# Patient Record
Sex: Male | Born: 1935 | Hispanic: Refuse to answer | Marital: Married | Smoking: Former smoker
Health system: Southern US, Community
[De-identification: ages and names within clinical notes are randomized; demographics above are authoritative.]

## PROBLEM LIST (undated history)

## (undated) DIAGNOSIS — A6001 Herpesviral infection of penis: Secondary | ICD-10-CM

## (undated) DIAGNOSIS — N393 Stress incontinence (female) (male): Secondary | ICD-10-CM

## (undated) DIAGNOSIS — I10 Essential (primary) hypertension: Secondary | ICD-10-CM

## (undated) DIAGNOSIS — J40 Bronchitis, not specified as acute or chronic: Secondary | ICD-10-CM

## (undated) DIAGNOSIS — I2510 Atherosclerotic heart disease of native coronary artery without angina pectoris: Secondary | ICD-10-CM

## (undated) DIAGNOSIS — J84112 Idiopathic pulmonary fibrosis: Secondary | ICD-10-CM

## (undated) DIAGNOSIS — J159 Unspecified bacterial pneumonia: Secondary | ICD-10-CM

## (undated) HISTORY — DX: Bronchitis, not specified as acute or chronic: J40

## (undated) HISTORY — DX: Essential (primary) hypertension: I10

## (undated) HISTORY — DX: Idiopathic pulmonary fibrosis: J84.112

## (undated) HISTORY — PX: WRIST SURGERY: SHX841

## (undated) HISTORY — DX: Atherosclerotic heart disease of native coronary artery without angina pectoris: I25.10

## (undated) HISTORY — DX: Herpesviral infection of penis: A60.01

## (undated) HISTORY — DX: Stress incontinence (female) (male): N39.3

## (undated) HISTORY — PX: CATARACT EXTRACTION, BILATERAL: SHX1313

## (undated) HISTORY — DX: Unspecified bacterial pneumonia: J15.9

---

## 2002-02-07 DIAGNOSIS — A6001 Herpesviral infection of penis: Secondary | ICD-10-CM

## 2002-02-07 HISTORY — DX: Herpesviral infection of penis: A60.01

## 2007-06-14 DIAGNOSIS — J159 Unspecified bacterial pneumonia: Secondary | ICD-10-CM

## 2007-06-14 HISTORY — DX: Unspecified bacterial pneumonia: J15.9

## 2010-11-04 ENCOUNTER — Encounter: Payer: Self-pay | Admitting: Emergency Medicine

## 2010-11-04 DIAGNOSIS — I10 Essential (primary) hypertension: Secondary | ICD-10-CM | POA: Insufficient documentation

## 2010-11-04 DIAGNOSIS — M109 Gout, unspecified: Secondary | ICD-10-CM | POA: Insufficient documentation

## 2010-11-04 DIAGNOSIS — J309 Allergic rhinitis, unspecified: Secondary | ICD-10-CM | POA: Insufficient documentation

## 2010-11-04 DIAGNOSIS — R0600 Dyspnea, unspecified: Secondary | ICD-10-CM

## 2010-11-04 DIAGNOSIS — I2510 Atherosclerotic heart disease of native coronary artery without angina pectoris: Secondary | ICD-10-CM | POA: Insufficient documentation

## 2010-11-05 ENCOUNTER — Ambulatory Visit (INDEPENDENT_AMBULATORY_CARE_PROVIDER_SITE_OTHER): Payer: Medicare Other | Admitting: Emergency Medicine

## 2010-11-05 ENCOUNTER — Encounter: Payer: Self-pay | Admitting: Emergency Medicine

## 2010-11-05 ENCOUNTER — Other Ambulatory Visit (HOSPITAL_COMMUNITY): Payer: Self-pay | Admitting: Emergency Medicine

## 2010-11-05 VITALS — BP 108/70 | HR 59 | Temp 97.5°F | Ht 69.0 in | Wt 173.6 lb

## 2010-11-05 DIAGNOSIS — J849 Interstitial pulmonary disease, unspecified: Secondary | ICD-10-CM | POA: Insufficient documentation

## 2010-11-05 DIAGNOSIS — R05 Cough: Secondary | ICD-10-CM

## 2010-11-05 DIAGNOSIS — J84115 Respiratory bronchiolitis interstitial lung disease: Secondary | ICD-10-CM

## 2010-11-05 DIAGNOSIS — R1310 Dysphagia, unspecified: Secondary | ICD-10-CM

## 2010-11-05 NOTE — Patient Instructions (Signed)
We will set up breathing tests to be done at your next visit We will arrange for a swallowing test  Stop your [facility] for now Your walking oximetry shows that you will benefit from oxygen to wear when you are exerting yourself Follow up with Dr Delton Coombes in 1 month

## 2010-11-05 NOTE — Assessment & Plan Note (Addendum)
Stable CT scan from 2009 to 05/2010. I suspect that he is chronically aspirating and that he had a RUL PNA at some point with focal scar. Not clear whether he has clinically significant COPD although his CT scan does show some areas that look emphysematous. His smoking hx is minor.  - walking oximetry today shows desaturations - will arrange for O2 - swallow eval speech rx eval - full PFT - stop Dulera for now given his cataracts and his UA irritation and hoarse voice.  - rov after testing to review

## 2010-11-05 NOTE — Progress Notes (Deleted)
  Subjective:    Patient ID: Danny Alvarado, male    DOB: 28-Jun-1927, 77 y.o.   MRN: [id number]  HPI    Review of Systems  Constitutional: Negative.   HENT: Positive for sore throat.   Eyes: Negative.   Respiratory: Positive for cough and shortness of breath.   Cardiovascular: Positive for chest pain.  Gastrointestinal: Negative.   Genitourinary: Negative.   Musculoskeletal: Negative.   Skin: Negative.   Neurological: Negative.   Hematological: Negative.   Psychiatric/Behavioral: The patient is nervous/anxious.        Objective:   Physical Exam        Assessment & Plan:  SATURATION QUALIFICATIONS:  Patient Saturations on Room Air at Rest = 98%  Patient Saturations on Room Air while Ambulating = 86%  Patient Saturations on 2 Liters of oxygen while Ambulating = 91%

## 2010-11-05 NOTE — Progress Notes (Signed)
Subjective:    Patient ID: Danny Alvarado, male    DOB: 14-Apr-1927, 77 y.o.   MRN: [id number]  HPI 77 yo man, former smoker, hx of HTN, anxiety, cataracts. He has hx chronic dyspnea, tells me that he felt well until acute episode in March when he woke up with SOB such that he couldn't move about, couldn't eat or drink. Was taken to [facility] and treated for an apparent panic attack - breathing returned to normal. He had CP, but no fever, no cough or sputum. Was treated with abx.  He feels like he is back to baseline but continues to have exertional SOB, nasal congestion, hoarse voice. He has been treated with Spiriva without effect, recently changed to [facility] - may help some, but minimally. A CT scan in March showed RUL GGI/scar and some mild peripheral ILD. Repeat scan in April without any change. In retrospect he has a CT scan from 2009 that shows the RUL and peripheral ILD are unchanged over that time period.   SATURATION QUALIFICATIONS:  Patient Saturations on Room Air at Rest = 98%  Patient Saturations on Room Air while Ambulating = 86%  Patient Saturations on 2 Liters of oxygen while Ambulating = 91%   Review of Systems  Constitutional: Positive for activity change. Negative for fever, chills, diaphoresis, fatigue and unexpected weight change.  HENT: Positive for rhinorrhea and postnasal drip.   Respiratory: Positive for choking (gets choked on food, drink, saliva - happens every few weeks), chest tightness, shortness of breath and stridor. Negative for apnea, cough and wheezing.   Cardiovascular: Positive for chest pain.    Past Medical History  Diagnosis Date  . Gout 10/20/2005  . Bacterial pneumonia 06/14/2007  . Allergic rhinitis 03/31/2005  . CAD (coronary artery disease)   . Hypertension   . Herpetic infection of penis 02/07/2002  . Bronchitis   . Male stress incontinence   . IPF (idiopathic pulmonary fibrosis)      Family History  Problem Relation Age of Onset  .  Diabetes Mother      History   Social History  . Marital Status: Married    Spouse Name: N/A    Number of Children: N/A  . Years of Education: N/A   Occupational History  . Not on file.   Social History Main Topics  . Smoking status: Former Smoker -- 0.3 packs/day for 40 years    Types: Cigarettes    Quit date: 02/25/1988  . Smokeless tobacco: Never Used  . Alcohol Use: No  . Drug Use: No  . Sexually Active: Not on file   Other Topics Concern  . Not on file   Social History Narrative  . No narrative on file     Allergies  Allergen Reactions  . Tessalon Perles Hives     Outpatient Prescriptions Prior to Visit  Medication Sig Dispense Refill  . atenolol (TENORMIN) 25 MG tablet Take 25 mg by mouth daily.        . Mometasone Furo-Formoterol Fum (DULERA) 200-5 MCG/ACT AERO Inhale 2 puffs into the lungs 2 (two) times daily.        Marland Kitchen NIFEdipine (PROCARDIA-XL/ADALAT CC) 60 MG 24 hr tablet Take 60 mg by mouth daily.        . solifenacin (VESICARE) 10 MG tablet Take 10 mg by mouth daily.             Objective:   Physical Exam  Gen: Pleasant, elderly man, in no distress,  normal affect  ENT: No lesions,  mouth clear,  oropharynx clear, no postnasal drip, hoarse voice  Neck: No JVD, no TMG, no carotid bruits  Lungs: No use of accessory muscles, some soft insp crackles RUL  Cardiovascular: RRR, heart sounds normal, no murmur or gallops, no peripheral edema  Musculoskeletal: No deformities, no cyanosis or clubbing  Neuro: alert, non focal  Skin: Warm, no lesions or rashes      Assessment & Plan:  ILD (interstitial lung disease) Stable CT scan from 2009 to 05/2010. I suspect that he is chronically aspirating and that he had a RUL PNA at some point with focal scar. Not clear whether he has clinically significant COPD although his CT scan does show some areas that look emphysematous. His smoking hx is minor.  - walking oximetry today shows desaturations - will  arrange for O2 - swallow eval speech rx eval - full PFT - stop Dulera for now given his cataracts and his UA irritation and hoarse voice.  - rov after testing to review

## 2010-11-18 ENCOUNTER — Ambulatory Visit (HOSPITAL_COMMUNITY)
Admission: RE | Admit: 2010-11-18 | Discharge: 2010-11-18 | Disposition: A | Discharge status: Home / Self Care | Payer: Medicare Other | Source: Ambulatory Visit | Attending: Emergency Medicine | Admitting: Emergency Medicine

## 2010-11-18 DIAGNOSIS — R059 Cough, unspecified: Secondary | ICD-10-CM | POA: Insufficient documentation

## 2010-11-18 DIAGNOSIS — R1310 Dysphagia, unspecified: Secondary | ICD-10-CM

## 2010-11-18 DIAGNOSIS — R05 Cough: Secondary | ICD-10-CM

## 2010-11-18 DIAGNOSIS — J849 Interstitial pulmonary disease, unspecified: Secondary | ICD-10-CM

## 2010-11-27 ENCOUNTER — Ambulatory Visit (INDEPENDENT_AMBULATORY_CARE_PROVIDER_SITE_OTHER): Payer: Medicare Other | Admitting: Emergency Medicine

## 2010-11-27 ENCOUNTER — Encounter: Payer: Self-pay | Admitting: Emergency Medicine

## 2010-11-27 VITALS — BP 118/70 | HR 63 | Temp 97.8°F | Ht 69.0 in | Wt 172.0 lb

## 2010-11-27 DIAGNOSIS — J84115 Respiratory bronchiolitis interstitial lung disease: Secondary | ICD-10-CM

## 2010-11-27 DIAGNOSIS — J849 Interstitial pulmonary disease, unspecified: Secondary | ICD-10-CM

## 2010-11-27 LAB — PULMONARY FUNCTION TEST

## 2010-11-27 NOTE — Progress Notes (Signed)
  Subjective:    Patient ID: Danny Alvarado, male    DOB: 09-18-1927, 77 y.o.   MRN: [id number]  HPI 77 yo man, former smoker, hx of HTN, anxiety, cataracts. He has hx chronic dyspnea, tells me that he felt well until acute episode in March when he woke up with SOB such that he couldn't move about, couldn't eat or drink. Was taken to [facility] and treated for an apparent panic attack - breathing returned to normal. He had CP, but no fever, no cough or sputum. Was treated with abx.  He feels like he is back to baseline but continues to have exertional SOB, nasal congestion, hoarse voice. He has been treated with Spiriva without effect, recently changed to [facility] - may help some, but minimally. A CT scan in March showed RUL GGI/scar and some mild peripheral ILD. Repeat scan in April without any change. In retrospect he has a CT scan from 2009 that shows the RUL and peripheral ILD are unchanged over that time period.   SATURATION QUALIFICATIONS:  Patient Saturations on Room Air at Rest = 98%  Patient Saturations on Room Air while Ambulating = 86%  Patient Saturations on 2 Liters of oxygen while Ambulating = 91%  ROV 11/27/10 -- returns for ILD stable on CT scan since 2009, hx tobacco. Last time we started O2 with exertion.  PFT done today showed normal airflows, decreased RV, decreased DLCO.  We stopped Dulera last time, he doesn't miss it.Voice is better.  He feels that the O2 is helping his exertional tolerance.     Review of Systems  Constitutional: Positive for activity change. Negative for fever, chills, diaphoresis, fatigue and unexpected weight change.  HENT: Positive for rhinorrhea and postnasal drip.   Respiratory: Positive for choking (gets choked on food, drink, saliva - happens every few weeks), chest tightness, shortness of breath and stridor. Negative for apnea, cough and wheezing.   Cardiovascular: Positive for chest pain.       Objective:   Physical Exam  Gen: Pleasant, elderly  man, in no distress,  normal affect  ENT: No lesions,  mouth clear,  oropharynx clear, no postnasal drip, hoarse voice  Neck: No JVD, no TMG, no carotid bruits  Lungs: No use of accessory muscles, some soft insp crackles RUL  Cardiovascular: RRR, heart sounds normal, no murmur or gallops, no peripheral edema  Musculoskeletal: No deformities, no cyanosis or clubbing  Neuro: alert, non focal  Skin: Warm, no lesions or rashes      Assessment & Plan:  ILD (interstitial lung disease) MBS showed no aspiration. CT has been stable since 2009. He has PFT that show decrease DLCO consistent w ILD, nothing to support COPD.  - do not believe he needs BD's - we have confirmed that he does need O2 with exertion, he has benefited - we discussed possibly checking auto-immune labs to eval the ILD further. Given his age and the stabilty on CT scan, I don't know that this would be high yield. Will defer for now.

## 2010-11-27 NOTE — Patient Instructions (Signed)
Wear your oxygen with exertion We will not start any inhaled medications at this time.  We will repeat your CXR in a year, or sooner if your breathing changes in any way. Call our office if you have any problems.

## 2010-11-27 NOTE — Assessment & Plan Note (Addendum)
MBS showed no aspiration. CT has been stable since 2009. He has PFT that show decrease DLCO consistent w ILD, nothing to support COPD.  - do not believe he needs BD's - we have confirmed that he does need O2 with exertion, he has benefited - we discussed possibly checking auto-immune labs to eval the ILD further. Given his age and the stabilty on CT scan, I don't know that this would be high yield. Will defer for now.

## 2010-11-27 NOTE — Progress Notes (Signed)
PFT done today. 

## 2010-11-28 ENCOUNTER — Encounter: Payer: Self-pay | Admitting: Emergency Medicine

## 2011-07-30 ENCOUNTER — Telehealth: Payer: Self-pay | Admitting: Emergency Medicine

## 2011-07-30 NOTE — Telephone Encounter (Signed)
I spoke with the pt wife and she states the pt has been having increased SOB and chest congestion, and dry cough x 3 days. Pt feels a lot of phelgm in his chest but cannot cough it up. Pt last seen 11-2010 so appt offered for today but pt requested an appt tomorrow. Appt set fro [facility] tomorrow at 10:45. Carron Curie, CMA

## 2011-07-31 ENCOUNTER — Encounter: Payer: Self-pay | Admitting: Pulmonary Disease

## 2011-07-31 ENCOUNTER — Ambulatory Visit (INDEPENDENT_AMBULATORY_CARE_PROVIDER_SITE_OTHER): Payer: Medicare Other | Admitting: Pulmonary Disease

## 2011-07-31 ENCOUNTER — Ambulatory Visit (INDEPENDENT_AMBULATORY_CARE_PROVIDER_SITE_OTHER)
Admission: RE | Admit: 2011-07-31 | Discharge: 2011-07-31 | Disposition: A | Discharge status: Home / Self Care | Payer: Medicare Other | Source: Ambulatory Visit | Attending: Pulmonary Disease | Admitting: Pulmonary Disease

## 2011-07-31 VITALS — BP 108/78 | HR 67 | Temp 98.5°F | Ht 68.0 in | Wt 166.8 lb

## 2011-07-31 DIAGNOSIS — J849 Interstitial pulmonary disease, unspecified: Secondary | ICD-10-CM

## 2011-07-31 DIAGNOSIS — J8410 Pulmonary fibrosis, unspecified: Secondary | ICD-10-CM

## 2011-07-31 NOTE — Progress Notes (Signed)
Addended by: Nita Sells on: 07/31/2011 11:32 AM   Modules accepted: Orders

## 2011-07-31 NOTE — Patient Instructions (Signed)
Will check a cxr today for completeness, and call you with results. Will send a note to your primary physician about today's visit.  You need to call them for evaluation of your leg weakness and dizziness. Keep already scheduled followup with Dr. Delton Coombes.

## 2011-07-31 NOTE — Assessment & Plan Note (Signed)
The patient has multiple complaints today, most of which are most likely related to failure to thrive.  His main complaint is that of dizziness and significant leg weakness.  He also notes some increasing shortness of breath since the last visit here.  His lungs are actually fairly clear except for minimal basilar crackles, and his oxygen saturations are adequate.  He has no increased work of breathing.  I will check a chest x-ray today for completeness, but I have stressed to he and his wife they need to call his primary care physician for evaluation of his failure to thrive symptoms.

## 2011-07-31 NOTE — Progress Notes (Signed)
  Subjective:    Patient ID: Danny Alvarado, male    DOB: 1927-11-29, 78 y.o.   MRN: [id number]  HPI The patient comes in today for an acute sick visit.  He has known interstitial lung disease that is fairly mild, and is usually followed by Dr. Delton Coombes.  He notes increased dizziness and also has significant lower extremity weakness that is leading to decreased ambulation and activity.  He has some increase in shortness of breath from baseline, but it is not significant.  He has very little cough or congestion and no mucus production.  His blood pressure and oxygen saturations are adequate today.  He notes an occasional episode of chest discomfort with ambulation, but states that it is completely different than his anginal type pain in the past.   Review of Systems  Constitutional: Negative for fever and unexpected weight change.  HENT: Positive for rhinorrhea. Negative for ear pain, nosebleeds, congestion, sore throat, sneezing, trouble swallowing, dental problem, postnasal drip and sinus pressure.   Eyes: Negative.  Negative for redness and itching.  Respiratory: Positive for cough and shortness of breath. Negative for chest tightness and wheezing.   Cardiovascular: Positive for chest pain. Negative for palpitations and leg swelling.  Gastrointestinal: Negative.  Negative for nausea and vomiting.  Genitourinary: Negative.  Negative for dysuria.  Musculoskeletal: Negative.  Negative for joint swelling.  Skin: Negative.  Negative for rash.  Neurological: Positive for dizziness, tremors, weakness and numbness. Negative for headaches.  Hematological: Negative.  Does not bruise/bleed easily.  Psychiatric/Behavioral: Negative.  Negative for dysphoric mood. The patient is not nervous/anxious.        Objective:   Physical Exam Frail appearing male in no acute distress Nose without purulence or discharge noted Chest with faint basilar crackles, excellent air flow, no wheezes or rhonchi. Cardiac exam  with regular rate and rhythm Lower extremities with no significant edema, no cyanosis Alert and oriented, moves all 4 extremities, extremely weak in both legs with ambulation.       Assessment & Plan:

## 2011-09-18 ENCOUNTER — Ambulatory Visit: Payer: Medicare Other | Admitting: Emergency Medicine

## 2011-11-25 ENCOUNTER — Encounter: Payer: Self-pay | Admitting: Emergency Medicine

## 2011-11-25 ENCOUNTER — Ambulatory Visit (INDEPENDENT_AMBULATORY_CARE_PROVIDER_SITE_OTHER): Payer: Medicare Other | Admitting: Emergency Medicine

## 2011-11-25 VITALS — BP 90/58 | HR 63 | Temp 97.9°F | Ht 69.0 in | Wt 161.6 lb

## 2011-11-25 DIAGNOSIS — J8410 Pulmonary fibrosis, unspecified: Secondary | ICD-10-CM

## 2011-11-25 DIAGNOSIS — J849 Interstitial pulmonary disease, unspecified: Secondary | ICD-10-CM

## 2011-11-25 NOTE — Progress Notes (Signed)
  Subjective:    Patient ID: Danny Alvarado, male    DOB: 18-Mar-1927, 78 y.o.   MRN: [id number]  HPI 78 yo man, former smoker, hx of HTN, anxiety, cataracts. He has hx chronic dyspnea, tells me that he felt well until acute episode in March when he woke up with SOB such that he couldn't move about, couldn't eat or drink. Was taken to [facility] and treated for an apparent panic attack - breathing returned to normal. He had CP, but no fever, no cough or sputum. Was treated with abx.  He feels like he is back to baseline but continues to have exertional SOB, nasal congestion, hoarse voice. He has been treated with Spiriva without effect, recently changed to [facility] - may help some, but minimally. A CT scan in March showed RUL GGI/scar and some mild peripheral ILD. Repeat scan in April without any change. In retrospect he has a CT scan from 2009 that shows the RUL and peripheral ILD are unchanged over that time period.   SATURATION QUALIFICATIONS:  Patient Saturations on Room Air at Rest = 98%  Patient Saturations on Room Air while Ambulating = 86%  Patient Saturations on 2 Liters of oxygen while Ambulating = 91%  ROV 11/27/10 -- returns for ILD stable on CT scan since 2009, hx tobacco. Last time we started O2 with exertion.  PFT done today showed normal airflows, decreased RV, decreased DLCO.  We stopped Dulera last time, he doesn't miss it.Voice is better.  He feels that the O2 is helping his exertional tolerance.   ROV 11/25/11 -- Hx ILD, Hx tobacco use.  Here for regular f/u.  Now following with Dr Dulce Sellar in [place]. Off b-blocker, now on lisinopril/HCTZ. He is having problems with nasal gtt, taking loratadine prn.  His breathing has been slowly worse. He has not been wearing the o2 with exertion reliably.      Objective:   Physical Exam Filed Vitals:   11/25/11 1429  BP: 90/58  Pulse: 63  Temp: 97.9 F (36.6 C)    Gen: Pleasant, elderly man, in no distress,  normal affect  ENT: No lesions,   mouth clear,  oropharynx clear, no postnasal drip, hoarse voice  Neck: No JVD, no TMG, no carotid bruits  Lungs: No use of accessory muscles, some soft insp crackles RUL  Cardiovascular: RRR, heart sounds normal, no murmur or gallops, no peripheral edema  Musculoskeletal: No deformities, no cyanosis or clubbing  Neuro: alert, non focal  Skin: Warm, no lesions or rashes      Assessment & Plan:  ILD (interstitial lung disease) - repeat CT scan of the chest to look for interval change - encouraged him to wear his oxygen more reliably.  - follow in 1 month after the CT scan is done.

## 2011-11-25 NOTE — Assessment & Plan Note (Signed)
-   repeat CT scan of the chest to look for interval change - encouraged him to wear his oxygen more reliably.  - follow in 1 month after the CT scan is done.

## 2011-11-25 NOTE — Patient Instructions (Addendum)
We will perform CT scan of the chest to compare with prior.  Please start wearing your oxygen with all exertion including walking  Follow with Dr Delton Coombes in 1 month to review the CT scan.

## 2011-12-01 ENCOUNTER — Ambulatory Visit (INDEPENDENT_AMBULATORY_CARE_PROVIDER_SITE_OTHER)
Admission: RE | Admit: 2011-12-01 | Discharge: 2011-12-01 | Disposition: A | Discharge status: Home / Self Care | Payer: Medicare Other | Source: Ambulatory Visit | Attending: Emergency Medicine | Admitting: Emergency Medicine

## 2011-12-01 DIAGNOSIS — J8410 Pulmonary fibrosis, unspecified: Secondary | ICD-10-CM

## 2011-12-01 DIAGNOSIS — J849 Interstitial pulmonary disease, unspecified: Secondary | ICD-10-CM

## 2011-12-08 ENCOUNTER — Telehealth: Payer: Self-pay | Admitting: Emergency Medicine

## 2011-12-08 NOTE — Telephone Encounter (Signed)
Pt is requesting results of CT from 12-01-11. Please advise.Carron Curie, CMA

## 2011-12-09 NOTE — Telephone Encounter (Signed)
Please notify patient that his scarring is largely unchanged compared with his prior CT scan in 05/2010. There may be some minimal progression. Thanks

## 2011-12-09 NOTE — Telephone Encounter (Signed)
ATC x 2 and line was busy, WCB 

## 2011-12-09 NOTE — Telephone Encounter (Signed)
Spoke with the pt's spouse and notified of ct results per RB She verbalized understanding and states nothing further needed

## 2011-12-29 ENCOUNTER — Ambulatory Visit (INDEPENDENT_AMBULATORY_CARE_PROVIDER_SITE_OTHER): Payer: Medicare Other | Admitting: Emergency Medicine

## 2011-12-29 ENCOUNTER — Encounter: Payer: Self-pay | Admitting: Emergency Medicine

## 2011-12-29 VITALS — BP 114/82 | HR 62 | Temp 97.6°F | Ht 65.0 in | Wt 163.2 lb

## 2011-12-29 DIAGNOSIS — J849 Interstitial pulmonary disease, unspecified: Secondary | ICD-10-CM

## 2011-12-29 DIAGNOSIS — J8410 Pulmonary fibrosis, unspecified: Secondary | ICD-10-CM

## 2011-12-29 NOTE — Progress Notes (Signed)
  Subjective:    Patient ID: Danny Alvarado, male    DOB: Jan 25, 1928, 78 y.o.   MRN: [id number]  HPI 78 yo man, former smoker, hx of HTN, anxiety, cataracts. He has hx chronic dyspnea, tells me that he felt well until acute episode in March when he woke up with SOB such that he couldn't move about, couldn't eat or drink. Was taken to [facility] and treated for an apparent panic attack - breathing returned to normal. He had CP, but no fever, no cough or sputum. Was treated with abx.  He feels like he is back to baseline but continues to have exertional SOB, nasal congestion, hoarse voice. He has been treated with Spiriva without effect, recently changed to [facility] - may help some, but minimally. A CT scan in March showed RUL GGI/scar and some mild peripheral ILD. Repeat scan in April without any change. In retrospect he has a CT scan from 2009 that shows the RUL and peripheral ILD are unchanged over that time period.   SATURATION QUALIFICATIONS:  Patient Saturations on Room Air at Rest = 98%  Patient Saturations on Room Air while Ambulating = 86%  Patient Saturations on 2 Liters of oxygen while Ambulating = 91%  ROV 76/3/12 -- returns for ILD stable on CT scan since 2009, hx tobacco. Last time we started O2 with exertion.  PFT done today showed normal airflows, decreased RV, decreased DLCO.  We stopped Dulera last time, he doesn't miss it.Voice is better.  He feels that the O2 is helping his exertional tolerance.   ROV 76/1/13 -- Hx ILD, Hx tobacco use.  Here for regular f/u.  Now following with Dr Dulce Sellar in [place]. Off b-blocker, now on lisinopril/HCTZ. He is having problems with nasal gtt, taking loratadine prn.  His breathing has been slowly worse. He has not been wearing the o2 with exertion reliably.   ROV 76/4/13 - Hx ILD, Hx tobacco use. Has had slowly progressive DOE. Here to review CT scan chest 10/7 >> slight progression compared with 05/2010. Pt believes that his breathing is a bit better  today than a month ago.      Objective:   Physical Exam Filed Vitals:   12/29/11 1431  BP: 114/82  Pulse: 62  Temp: 97.6 F (36.4 C)    Gen: Pleasant, elderly man, in no distress,  normal affect  ENT: No lesions,  mouth clear,  oropharynx clear, no postnasal drip, hoarse voice  Neck: No JVD, no TMG, no carotid bruits  Lungs: No use of accessory muscles, some soft insp crackles RUL  Cardiovascular: RRR, heart sounds normal, no murmur or gallops, no peripheral edema  Musculoskeletal: No deformities, no cyanosis or clubbing  Neuro: alert, non focal  Skin: Warm, no lesions or rashes      Assessment & Plan:  ILD (interstitial lung disease) Slow CT scan worsening and clinical worsening. No real intervention to make at this time beyond wearing o2 reliably

## 2011-12-29 NOTE — Patient Instructions (Addendum)
Please wear your oxygen reliably with exertion and when you are sleeping Follow with Dr Delton Coombes in 6 months or sooner if you have any problems

## 2011-12-29 NOTE — Assessment & Plan Note (Signed)
Slow CT scan worsening and clinical worsening. No real intervention to make at this time beyond wearing o2 reliably

## 2012-07-20 ENCOUNTER — Telehealth: Payer: Self-pay | Admitting: Emergency Medicine

## 2012-07-20 NOTE — Telephone Encounter (Signed)
I agree but want him to been seen this month so we can look further into his symptoms.

## 2012-07-20 NOTE — Telephone Encounter (Signed)
Spoke with patients spouse, made her aware that patient needs to be seen this month per RB I have scheduled patient to be seen Tursday Jul 22, 2012 @ 130pm Nothing further needed at this time.

## 2012-07-20 NOTE — Telephone Encounter (Signed)
I spoke with the pt spouse and she states x 1 week the pt was increased SOB and chest pain/discomfort so she called the pt PCP, Dr. Yetta Flock and he rec to increase the pt oxygen to 3 liters. Pt spouse states they did this and not long after the pt pain went away and his SOB was improved. She states he has been doing so much better on the 3 liters. She states his appetite is improved and he is not having any cough, sob, or chest discomfort. She wanted to let Dr. Delton Coombes know so he was aware and to make sure that it was ok that the pt is now using 3 liters and if Dr. Delton Coombes had any other recs for the pt. Carron Curie, CMA  **When we call pt back we need to schedule them for their 6 month f/u which is due in May.**

## 2012-07-22 ENCOUNTER — Encounter: Payer: Self-pay | Admitting: Emergency Medicine

## 2012-07-22 ENCOUNTER — Ambulatory Visit (INDEPENDENT_AMBULATORY_CARE_PROVIDER_SITE_OTHER): Payer: Medicare Other | Admitting: Emergency Medicine

## 2012-07-22 VITALS — BP 118/76 | HR 57 | Temp 97.8°F | Ht 66.0 in | Wt 161.2 lb

## 2012-07-22 DIAGNOSIS — J849 Interstitial pulmonary disease, unspecified: Secondary | ICD-10-CM

## 2012-07-22 DIAGNOSIS — J309 Allergic rhinitis, unspecified: Secondary | ICD-10-CM

## 2012-07-22 DIAGNOSIS — J8410 Pulmonary fibrosis, unspecified: Secondary | ICD-10-CM

## 2012-07-22 NOTE — Progress Notes (Signed)
Subjective:    Patient ID: Danny Alvarado, male    DOB: [birth year], 79 y.o.   MRN: [id number]  HPI 79 yo man, former smoker, hx of HTN, anxiety, cataracts. He has hx chronic dyspnea, tells me that he felt well until acute episode in March when he woke up with SOB such that he couldn't move about, couldn't eat or drink. Was taken to [facility] and treated for an apparent panic attack - breathing returned to normal. He had CP, but no fever, no cough or sputum. Was treated with abx.  He feels like he is back to baseline but continues to have exertional SOB, nasal congestion, hoarse voice. He has been treated with Spiriva without effect, recently changed to [facility] - may help some, but minimally. A CT scan in March showed RUL GGI/scar and some mild peripheral ILD. Repeat scan in April without any change. In retrospect he has a CT scan from 2009 that shows the RUL and peripheral ILD are unchanged over that time period.   SATURATION QUALIFICATIONS:  Patient Saturations on Room Air at Rest = 98%  Patient Saturations on Room Air while Ambulating = 86%  Patient Saturations on 2 Liters of oxygen while Ambulating = 91%  ROV 11/27/10 -- returns for ILD stable on CT scan since 2009, hx tobacco. Last time we started O2 with exertion.  PFT done today showed normal airflows, decreased RV, decreased DLCO.  We stopped Dulera last time, he doesn't miss it.Voice is better.  He feels that the O2 is helping his exertional tolerance.   ROV 11/25/11 -- Hx ILD, Hx tobacco use.  Here for regular f/u.  Now following with Dr Dulce Sellar in [place]. Off b-blocker, now on lisinopril/HCTZ. He is having problems with nasal gtt, taking loratadine prn.  His breathing has been slowly worse. He has not been wearing the o2 with exertion reliably.   ROV 12/29/11 - Hx ILD, Hx tobacco use. Has had slowly progressive DOE. Here to review CT scan chest 12/01/11>> slight progression compared with 05/2010. Pt believes that his breathing is a bit better  today than a month ago.   ROV 07/22/12 -- Hx slowly progressive ILD, possible emphysematous changes on CT scan but normal AF. Did not benefit from [facility] in the past. Not currently on BD's. He has been having worsening functional capacity, more dyspnea, some panic. This responded to him turning up his O2 from 2L/min to 3L/min. He has had more cough, has benefited from mucinex DM. He is overall much better since increasing the O2.   Need to reassess with [facility] and insure that 3L/min is adequate.   PULMONARY FUNCTON TEST 11/27/2010  FVC 3.87  FEV1 2.95  FEV1/FVC 76.2  FVC  % Predicted 98  FEV % Predicted 118  FeF 25-75 2.49  FeF 25-75 % Predicted 2.11        Objective:   Physical Exam Filed Vitals:   07/22/12 1337  BP: 118/76  Pulse: 57  Temp: 97.8 F (36.6 C)    Gen: Pleasant, elderly man, in no distress,  normal affect  ENT: No lesions,  mouth clear,  oropharynx clear, no postnasal drip, hoarse voice  Neck: No JVD, no TMG, no carotid bruits  Lungs: No use of accessory muscles, some soft insp crackles RUL  Cardiovascular: RRR, heart sounds normal, no murmur or gallops, no peripheral edema  Musculoskeletal: No deformities, no cyanosis or clubbing  Neuro: alert, non focal  Skin: Warm, no lesions or rashes  Assessment & Plan:  ILD (interstitial lung disease) We will perform spirometry today to see if you are a candidate for a new medication to treat interstitial lung disease (scarring in the lungs) Please continue your oxygen at 3L/min for now. We will ask [facility] to walk with your to determine the correct dose for you to be using with exertion.  Follow in 2 months with a CXR  Allergic rhinitis mucinex DM

## 2012-07-22 NOTE — Patient Instructions (Addendum)
We will perform spirometry today to see if you are a candidate for a new medication to treat interstitial lung disease (scarring in the lungs) Please continue your oxygen at 3L/min for now. We will ask [facility] to walk with your to determine the correct dose for you to be using with exertion.  Follow in 2 months with a CXR

## 2012-07-22 NOTE — Assessment & Plan Note (Signed)
We will perform spirometry today to see if you are a candidate for a new medication to treat interstitial lung disease (scarring in the lungs) Please continue your oxygen at 3L/min for now. We will ask [facility] to walk with your to determine the correct dose for you to be using with exertion.  Follow in 2 months with a CXR 

## 2012-07-22 NOTE — Assessment & Plan Note (Signed)
mucinex DM

## 2012-09-20 ENCOUNTER — Telehealth: Payer: Self-pay | Admitting: Emergency Medicine

## 2012-09-20 NOTE — Telephone Encounter (Signed)
I spoke with spouse. She stated pt has had shingle for about 2 weeks now. Pt on 2nd round of valtrex and prednisone. He last saw RB 07/22/12 advised to f/u in 2 months w/ CXR. Pt scheduled appt 10/22/12. I advised will forward to RB as an FYI why pt cancelled appt.

## 2012-09-20 NOTE — Telephone Encounter (Signed)
OK thank you 

## 2012-09-22 ENCOUNTER — Ambulatory Visit: Payer: Medicare Other | Admitting: Emergency Medicine

## 2012-10-22 ENCOUNTER — Encounter: Payer: Self-pay | Admitting: Emergency Medicine

## 2012-10-22 ENCOUNTER — Ambulatory Visit (INDEPENDENT_AMBULATORY_CARE_PROVIDER_SITE_OTHER)
Admission: RE | Admit: 2012-10-22 | Discharge: 2012-10-22 | Disposition: A | Discharge status: Home / Self Care | Payer: Medicare Other | Source: Ambulatory Visit | Attending: Emergency Medicine | Admitting: Emergency Medicine

## 2012-10-22 ENCOUNTER — Ambulatory Visit (INDEPENDENT_AMBULATORY_CARE_PROVIDER_SITE_OTHER): Payer: Medicare Other | Admitting: Emergency Medicine

## 2012-10-22 VITALS — BP 110/74 | HR 70 | Ht 66.0 in | Wt 159.8 lb

## 2012-10-22 DIAGNOSIS — J8410 Pulmonary fibrosis, unspecified: Secondary | ICD-10-CM

## 2012-10-22 DIAGNOSIS — J849 Interstitial pulmonary disease, unspecified: Secondary | ICD-10-CM

## 2012-10-22 NOTE — Assessment & Plan Note (Signed)
Not clear thet there is a significant component of COPD here although CT scan has suggested some emphysema. Has not benefited from spiriva or dulera - try tudorza x 1 month and follow up to reassess - o2 at 3L/min all times, script confirmed with [facility] - rov 1 mon

## 2012-10-22 NOTE — Addendum Note (Signed)
Addended by: Orma Flaming D on: 10/22/2012 11:34 AM   Modules accepted: Orders

## 2012-10-22 NOTE — Progress Notes (Signed)
Quick Note:  Spoke with patients spouse, made her aware of results as listed below per RB Patient verbalized understanding and nothing further needed at this time ______

## 2012-10-22 NOTE — Patient Instructions (Addendum)
Please continue your oxygen at 3L/min at all times. We will hold off on using a chin strap to sleep for now.  We will start Danny Alvarado twice a day to see if it helps your breathing Follow with Dr Delton Coombes in 1 month

## 2012-10-22 NOTE — Progress Notes (Signed)
Subjective:    Patient ID: Danny Alvarado, male    DOB: [birth year], 79 y.o.   MRN: [id number]  HPI 79 yo man, former smoker, hx of HTN, anxiety, cataracts. He has hx chronic dyspnea, tells me that he felt well until acute episode in March when he woke up with SOB such that he couldn't move about, couldn't eat or drink. Was taken to [facility] and treated for an apparent panic attack - breathing returned to normal. He had CP, but no fever, no cough or sputum. Was treated with abx.  He feels like he is back to baseline but continues to have exertional SOB, nasal congestion, hoarse voice. He has been treated with Spiriva without effect, recently changed to [facility] - may help some, but minimally. A CT scan in March showed RUL GGI/scar and some mild peripheral ILD. Repeat scan in April without any change. In retrospect he has a CT scan from 2009 that shows the RUL and peripheral ILD are unchanged over that time period.   SATURATION QUALIFICATIONS:  Patient Saturations on Room Air at Rest = 98%  Patient Saturations on Room Air while Ambulating = 86%  Patient Saturations on 2 Liters of oxygen while Ambulating = 91%  ROV 11/27/10 -- returns for ILD stable on CT scan since 2009, hx tobacco. Last time we started O2 with exertion.  PFT done today showed normal airflows, decreased RV, decreased DLCO.  We stopped Dulera last time, he doesn't miss it.Voice is better.  He feels that the O2 is helping his exertional tolerance.   ROV 11/25/11 -- Hx ILD, Hx tobacco use.  Here for regular f/u.  Now following with Dr Dulce Sellar in [place]. Off b-blocker, now on lisinopril/HCTZ. He is having problems with nasal gtt, taking loratadine prn.  His breathing has been slowly worse. He has not been wearing the o2 with exertion reliably.   ROV 12/29/11 - Hx ILD, Hx tobacco use. Has had slowly progressive DOE. Here to review CT scan chest 12/01/11>> slight progression compared with 05/2010. Pt believes that his breathing is a bit better  today than a month ago.   ROV 07/22/12 -- Hx slowly progressive ILD, possible emphysematous changes on CT scan but normal AF. Did not benefit from [facility] in the past. Not currently on BD's. He has been having worsening functional capacity, more dyspnea, some panic. This responded to him turning up his O2 from 2L/min to 3L/min. He has had more cough, has benefited from mucinex DM. He is overall much better since increasing the O2.   Need to reassess with [facility] and insure that 3L/min is adequate.   ROV 10/22/12 -- Hx slowly progressive ILD, possible emphysematous changes on CT scan but normal AF. Regular f/u visit. Need to write order for [facility] for 3L/min O2. He had the shingles since last time. He states that his breathing has been a little short. He does walk some.    PULMONARY FUNCTON TEST 11/27/2010  FVC 3.87  FEV1 2.95  FEV1/FVC 76.2  FVC  % Predicted 98  FEV % Predicted 118  FeF 25-75 2.49  FeF 25-75 % Predicted 2.11        Objective:   Physical Exam Filed Vitals:   10/22/12 1109  BP: 110/74  Pulse: 70  Height: 5\' 6"  (1.676 m)  Weight: 159 lb 12.8 oz (72.485 kg)  SpO2: 100%    Gen: Pleasant, elderly man, in no distress,  normal affect  ENT: No lesions,  mouth clear,  oropharynx clear, no  postnasal drip, hoarse voice  Neck: No JVD, no TMG, no carotid bruits  Lungs: No use of accessory muscles, some soft insp crackles RUL  Cardiovascular: RRR, heart sounds normal, no murmur or gallops, no peripheral edema  Musculoskeletal: No deformities, no cyanosis or clubbing  Neuro: alert, non focal  Skin: Warm, no lesions or rashes      Assessment & Plan:  ILD (interstitial lung disease) Not clear thet there is a significant component of COPD here although CT scan has suggested some emphysema. Has not benefited from spiriva or dulera - try tudorza x 1 month and follow up to reassess - o2 at 3L/min all times, script confirmed with [facility] - rov 1 mon

## 2012-10-26 ENCOUNTER — Telehealth: Payer: Self-pay | Admitting: Emergency Medicine

## 2012-10-26 NOTE — Telephone Encounter (Signed)
Pt's wife is aware. Recall will be placed to remind [place] to call and schedule this appointment.

## 2012-10-26 NOTE — Telephone Encounter (Signed)
lmomtcb x1 

## 2012-10-26 NOTE — Telephone Encounter (Signed)
Pt is feeling better and after reading the side effects of the tudorza he decided not to take the medication. According to last OV note pt was to return in 1 month after taking tudorza. Pt wants to know since he never started the tudorza should he keep that 1 month appt or move it out? Please advise. Carron Curie, CMA

## 2012-10-26 NOTE — Telephone Encounter (Signed)
We can push it back to 4 months

## 2012-10-27 ENCOUNTER — Ambulatory Visit: Payer: Medicare Other | Admitting: Emergency Medicine

## 2012-11-18 ENCOUNTER — Ambulatory Visit: Payer: Medicare Other | Admitting: Emergency Medicine

## 2013-03-16 ENCOUNTER — Ambulatory Visit (INDEPENDENT_AMBULATORY_CARE_PROVIDER_SITE_OTHER): Payer: Medicare Other | Admitting: Emergency Medicine

## 2013-03-16 ENCOUNTER — Encounter: Payer: Self-pay | Admitting: Emergency Medicine

## 2013-03-16 VITALS — BP 130/82 | HR 85 | Ht 68.0 in | Wt 162.0 lb

## 2013-03-16 DIAGNOSIS — R05 Cough: Secondary | ICD-10-CM | POA: Insufficient documentation

## 2013-03-16 DIAGNOSIS — J849 Interstitial pulmonary disease, unspecified: Secondary | ICD-10-CM

## 2013-03-16 DIAGNOSIS — J309 Allergic rhinitis, unspecified: Secondary | ICD-10-CM

## 2013-03-16 DIAGNOSIS — I10 Essential (primary) hypertension: Secondary | ICD-10-CM

## 2013-03-16 DIAGNOSIS — J8410 Pulmonary fibrosis, unspecified: Secondary | ICD-10-CM

## 2013-03-16 DIAGNOSIS — R059 Cough, unspecified: Secondary | ICD-10-CM

## 2013-03-16 MED ORDER — LOSARTAN POTASSIUM-HCTZ 50-12.5 MG PO TABS: 1.0000 | ORAL_TABLET | Freq: Every day | ORAL | Status: AC

## 2013-03-16 NOTE — Patient Instructions (Signed)
Continue the Pulmicort nebulizers twice a day for now. We may decide to stop this in the future.  Continue to use mucinex-DM as you have been Start nasonex 2 sprays each nostril daily Stop your lisinopril-HCTZ (blood pressure medication) Start losartan-HCTZ 50-12.5mg  once a day.  Make sure Dr Hurman HornMounsey knows that we have made this adjustment in his BP medication.  Follow with Dr Delton CoombesByrum in 6 weeks or sooner if you have any problems

## 2013-03-16 NOTE — Assessment & Plan Note (Signed)
-   will treat nasal congestion more agressively - change lisinopril - HCTZ to ARB-HCTZ - continue the pulmicort for now although low threshold to stop next visit - rov 1

## 2013-03-16 NOTE — Assessment & Plan Note (Signed)
-   stop ACE-I and start losartan 50-12.5mg . May need to be adjusted. He has a f/u visit with Dr Hurman HornMounsey next month.

## 2013-03-16 NOTE — Assessment & Plan Note (Signed)
Appears to be clinically stable - no clear indication to repeat CT scan at this point. Will consider if cough persists or if he becomes more SOB

## 2013-03-16 NOTE — Progress Notes (Signed)
Subjective:    Patient ID: Danny Alvarado, male    DOB: Aug 21, 1927, 80 y.o.   MRN: [id number]  HPI 80 yo man, former smoker, hx of HTN, anxiety, cataracts. He has hx chronic dyspnea, tells me that he felt well until acute episode in March when he woke up with SOB such that he couldn't move about, couldn't eat or drink. Was taken to [facility] and treated for an apparent panic attack - breathing returned to normal. He had CP, but no fever, no cough or sputum. Was treated with abx.  He feels like he is back to baseline but continues to have exertional SOB, nasal congestion, hoarse voice. He has been treated with Spiriva without effect, [facility] may [facility] some, but minimally. A CT scan in March showed RUL GGI/scar and some mild peripheral ILD. Repeat scan in April without any change. In retrospect he has a CT scan from 2009 that shows the RUL and peripheral ILD are unchanged over that time period.   SATURATION QUALIFICATIONS:  Patient Saturations on Room Air at Rest = 98%  Patient Saturations on Room Air while Ambulating = 86%  Patient Saturations on 2 Liters of oxygen while Ambulating = 91%  ROV 11/27/10 -- returns for ILD stable on CT scan since 2009, hx tobacco. Last time we started O2 with exertion.  PFT done today showed normal airflows, decreased RV, decreased DLCO.  We stopped Dulera last time, he doesn't miss it.Voice is better.  He feels that the O2 is helping his exertional tolerance.   ROV 11/25/11 -- Hx ILD, Hx tobacco use.  Here for regular f/u.  [place] Off b-blocker, now on lisinopril/HCTZ. He is having problems with nasal gtt, taking loratadine prn.  His breathing has been slowly worse. He has not been wearing the o2 with exertion reliably.   ROV 12/29/11 - Hx ILD, Hx tobacco use. Has had slowly progressive DOE. Here to review CT scan chest 12/01/11>> slight progression compared with 05/2010. Pt believes that his breathing is a bit better  today than a month ago.   ROV 07/22/12 -- Hx slowly progressive ILD, possible emphysematous changes on CT scan but normal AF. [facility] Not currently on BD's. He has been having worsening functional capacity, more dyspnea, some panic. This responded to him turning up his O2 from 2L/min to 3L/min. He has had more cough, has benefited from mucinex DM. He is overall much better since increasing the O2.   Need to reassess with [facility] and insure that 3L/min is adequate.   ROV 10/22/12 -- Hx slowly progressive ILD, possible emphysematous changes on CT scan but normal AF. Regular f/u visit. Need to write order for [facility] for 3L/min O2. He had the shingles since last time. He states that his breathing has been a little short. He does walk some.   ROV 03/16/13 -- follows for hypoxemic resp failure, ILD and emphysematous areas on CT scan. Interestingly PFT show normal spirometry (? Mild mixed disease). His last CT scan was 11/2011, CXR stable last visit 10/22/12. Current O2 use is on 3L/min at all times. Started budesonide and albuterol nebs by Dr Yetta FlockHodges for cough recently after he did not improve on mucinex. The nebs were just started this week. His biggest concern today is cough. He denies any worsening in his dyspnea.    PULMONARY FUNCTON TEST 11/27/2010  FVC 3.87  FEV1 2.95  FEV1/FVC 76.2  FVC  %  Predicted 98  FEV % Predicted 118  FeF 25-75 2.49  FeF 25-75 % Predicted 2.11        Objective:   Physical Exam Filed Vitals:   03/16/13 1107  BP: 130/82  Pulse: 85  Height: 5\' 8"  (1.727 m)  Weight: 162 lb (73.483 kg)  SpO2: 97%    Gen: Pleasant, elderly man, in no distress,  normal affect  ENT: No lesions,  mouth clear,  oropharynx clear, no postnasal drip, hoarse voice  Neck: No JVD, no TMG, no carotid bruits  Lungs: No use of accessory muscles, some soft insp crackles RUL  Cardiovascular: RRR, heart sounds normal, no murmur or gallops, no peripheral  edema  Musculoskeletal: No deformities, no cyanosis or clubbing  Neuro: alert, non focal  Skin: Warm, no lesions or rashes      Assessment & Plan:  Allergic rhinitis Will start nasal steroid > nasonex 2 sprays qd Continue mucinex dm   Cough - will treat nasal congestion more agressively - change lisinopril - HCTZ to ARB-HCTZ - continue the pulmicort for now although low threshold to stop next visit - rov 1  ILD (interstitial lung disease) Appears to be clinically stable - no clear indication to repeat CT scan at this point. Will consider if cough persists or if he becomes more SOB

## 2013-03-16 NOTE — Assessment & Plan Note (Signed)
Will start nasal steroid > nasonex 2 sprays qd Continue mucinex dm

## 2013-03-29 ENCOUNTER — Ambulatory Visit: Payer: Self-pay | Admitting: Podiatrist

## 2013-04-22 ENCOUNTER — Telehealth: Payer: Self-pay | Admitting: Emergency Medicine

## 2013-04-22 NOTE — Telephone Encounter (Signed)
Error.Danny Alvarado ° °

## 2013-04-27 ENCOUNTER — Ambulatory Visit: Payer: Medicare Other | Admitting: Emergency Medicine

## 2013-05-25 DEATH — deceased

## 2013-07-08 IMAGING — CT CT CHEST W/O CM
2 of 6 series · 12 of 36 positions shown, 15 images · non-contrast
Comparison: Chest CT 06/17/2010.

CLINICAL DATA: Evaluate for interstitial lung disease.  Shortness
of breath.

CT CHEST WITHOUT CONTRAST
TECHNIQUE: Multidetector CT imaging of the chest was performed
following the standard protocol without IV contrast.

[Series 5: lung · axial · 0.75mm/px · z∈[-236,-11]mm · 9 of 57 slices shown, 12 images]
[im 6/57  mediastinal]
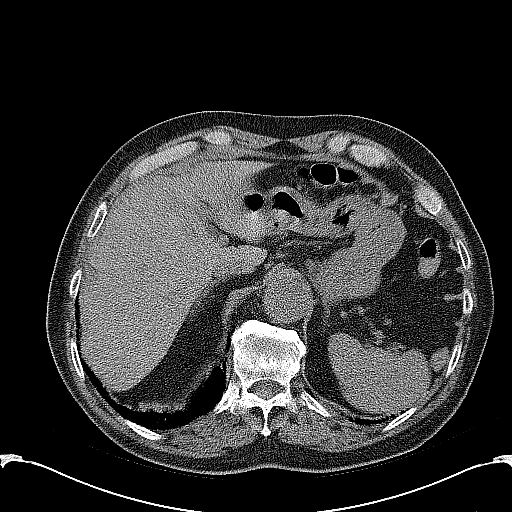
[im 6/57  lung]
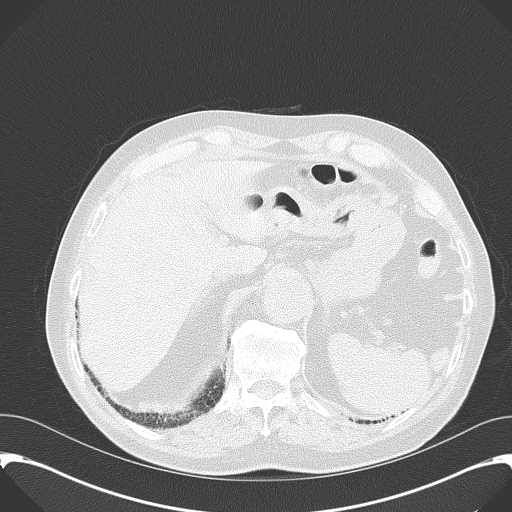
[im 12/57  lung]
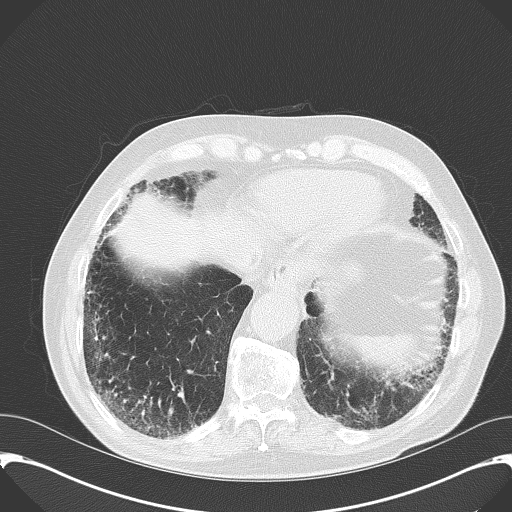
[im 17/57  lung]
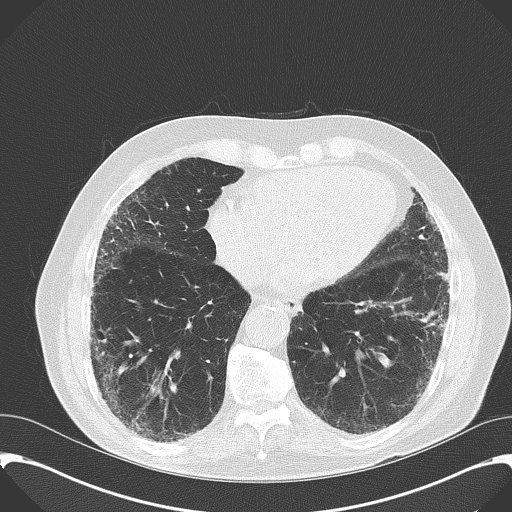
[im 23/57  lung]
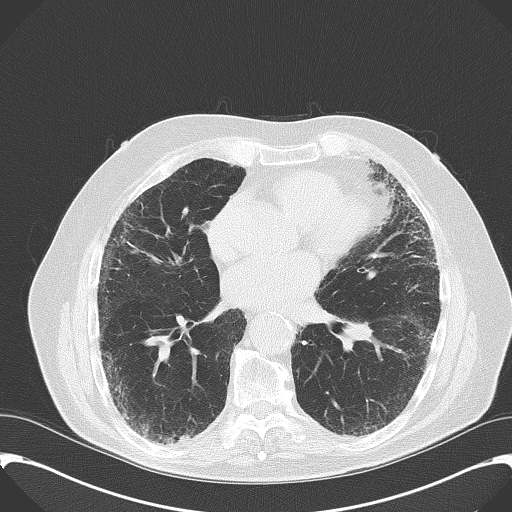
[im 29/57  mediastinal]
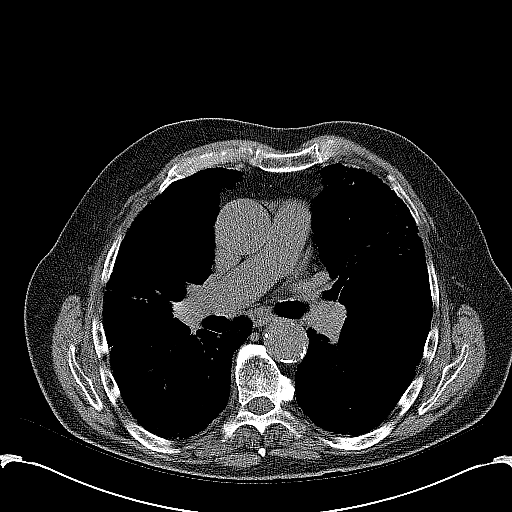
[im 29/57  lung]
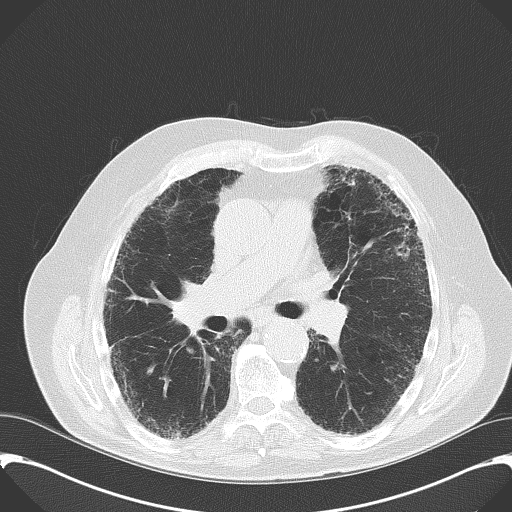
[im 34/57  lung]
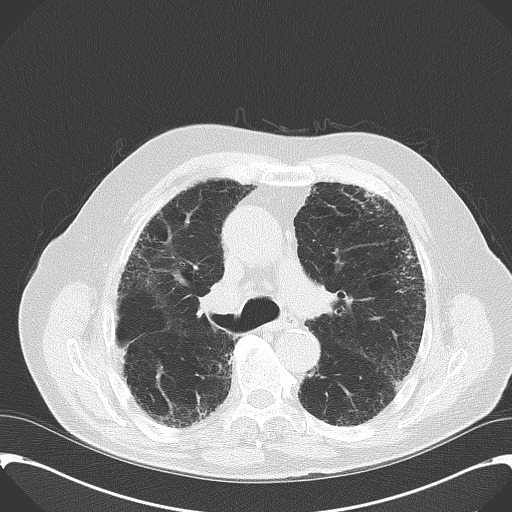
[im 40/57  lung]
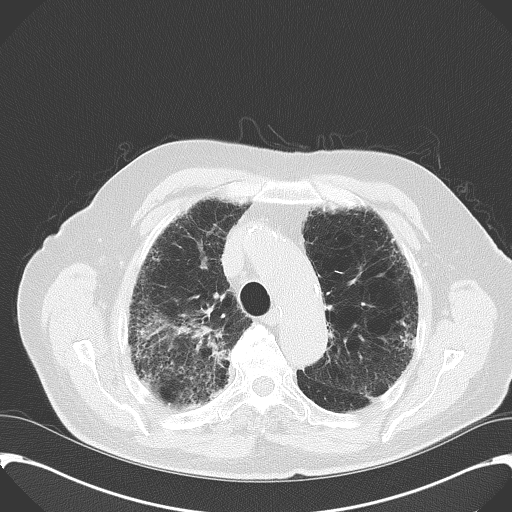
[im 45/57  lung]
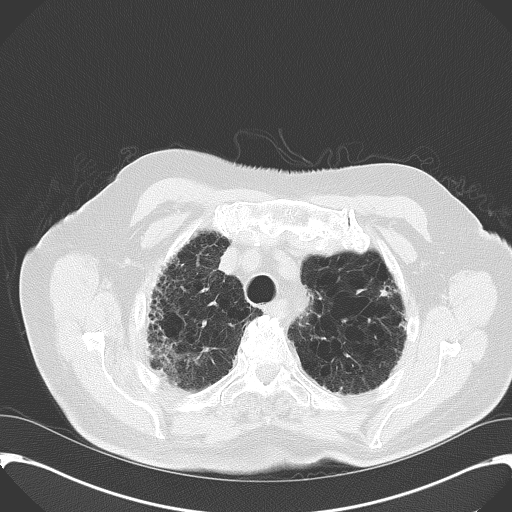
[im 51/57  mediastinal]
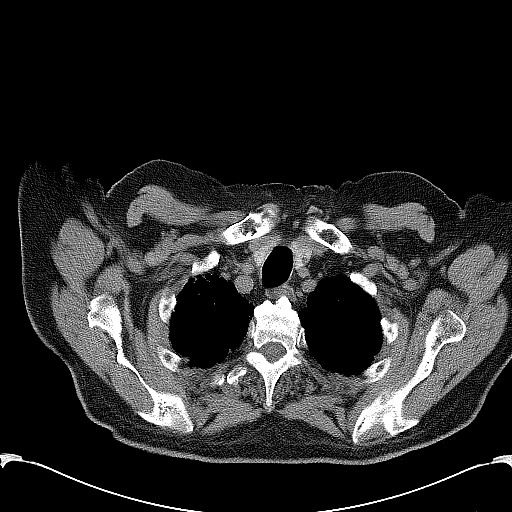
[im 51/57  lung]
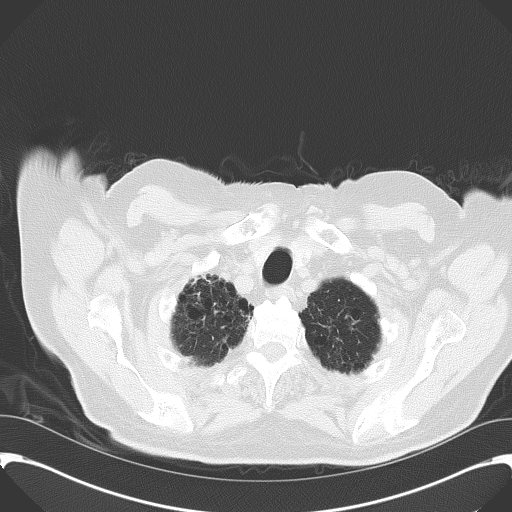

[Series 602: cor · coronal · 0.75mm/px · 3 of 114 slices shown]
[im 23/114  lung]
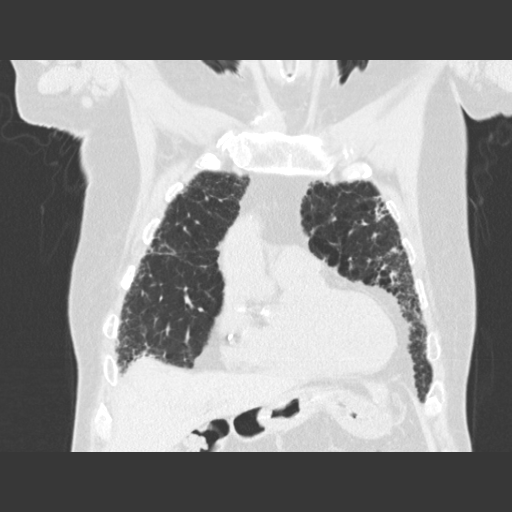
[im 46/114  lung]
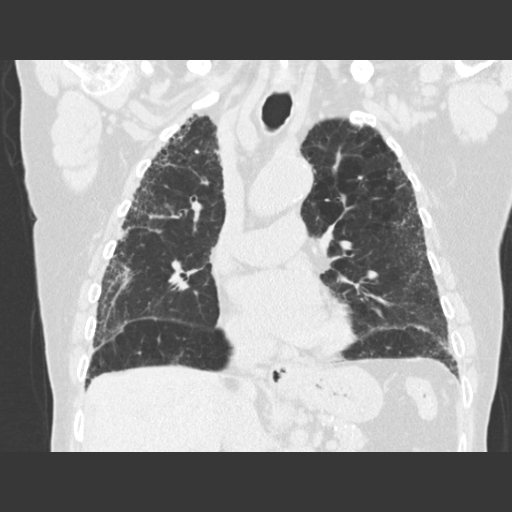
[im 68/114  lung]
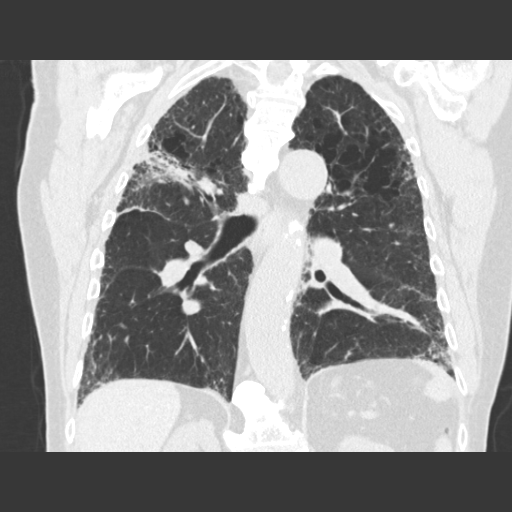

[12 of 36 positions shown; findings below may reference images not displayed]

FINDINGS: Mediastinum: Heart size is normal. There is no significant
pericardial fluid, thickening or pericardial calcification. There
is atherosclerosis of the thoracic aorta, the great vessels of the
mediastinum and the coronary arteries, including calcified
atherosclerotic plaque in the the left main, left anterior
descending, left circumflex and right coronary arteries.
Additionally, there is mild ectasia of the ascending thoracic aorta
(4.1 cm in diameter).  No pathologically enlarged mediastinal or
hilar lymph nodes. Please note that accurate exclusion of hilar
adenopathy is limited on noncontrast CT scans.  Esophagus is
unremarkable in appearance.

Lungs/Pleura: There is a background of moderate - severe
centrilobular emphysema.  Compared to the prior examination there
are increasing areas of subpleural reticulation throughout the
periphery of the lungs bilaterally, with a slight craniocaudal
gradient.  Findings have clearly progressed, particularly within
the lung bases.  There are some scattered areas of peripheral
honeycombing and several regions of traction bronchiectasis.  Mild
diffuse bronchial wall thickening is unchanged.  More focal area of
architectural distortion and chronic consolidation in the right
upper lobe is unchanged compared to the prior examination, and
likely to represent an area of post infectious scarring.  Upon this
background of chronic interstitial lung disease, accurate
assessment for small neoplasms is challenging, but no definite
large suspicious appearing pulmonary nodules or masses are
identified on today's examination.  There is one nodular area in
the periphery of the left upper lobe (image 13 of series 5)
measuring 6 mm that is slightly more conspicuous on the prior
examination, however, this is nonspecific.  Inspiratory and
expiratory imaging is unremarkable.

Upper Abdomen: Numerous calcifications throughout the pancreas,
presumably secondary to chronic pancreatitis.  Several small
calcifications are also noted within the liver, likely granulomas.

Musculoskeletal: There are no aggressive appearing lytic or blastic
lesions noted in the visualized portions of the skeleton.
IMPRESSION: 1.  Findings, as above, compatible with an interstitial lung
disease.  Given the presence of honeycombing, the slight
craniocaudal gradient, and the progression compared to the prior
examination, this is favored to represent usual interstitial
pneumonia (UIP).
2.  There is also a background of moderate - severe centrilobular
emphysema and diffuse bronchial wall thickening; findings
compatible with COPD.
3.  Slight interval enlargement of a 6 mm nodular opacity in the
left upper lobe (image 13 of series 5).  This is nonspecific, but
warrants attention on a follow-up chest CT at 6-12 months.  This
recommendation follows the consensus statement: Guidelines for
Management of Small Pulmonary Nodules Detected on CT Scans: A
Statement from the [HOSPITAL] as published in Radiology
0551; [DATE].
4. Atherosclerosis, including left main and three-vessel coronary
artery disease.
5.  Additional incidental findings, as above.

## 2014-05-30 IMAGING — CR DG CHEST 2V
2 series · 2 of 2 positions shown · non-contrast
Comparison: 12/01/2011

CLINICAL DATA: COPD

CHEST - 2 VIEW

[view not recorded (1 of 2)]
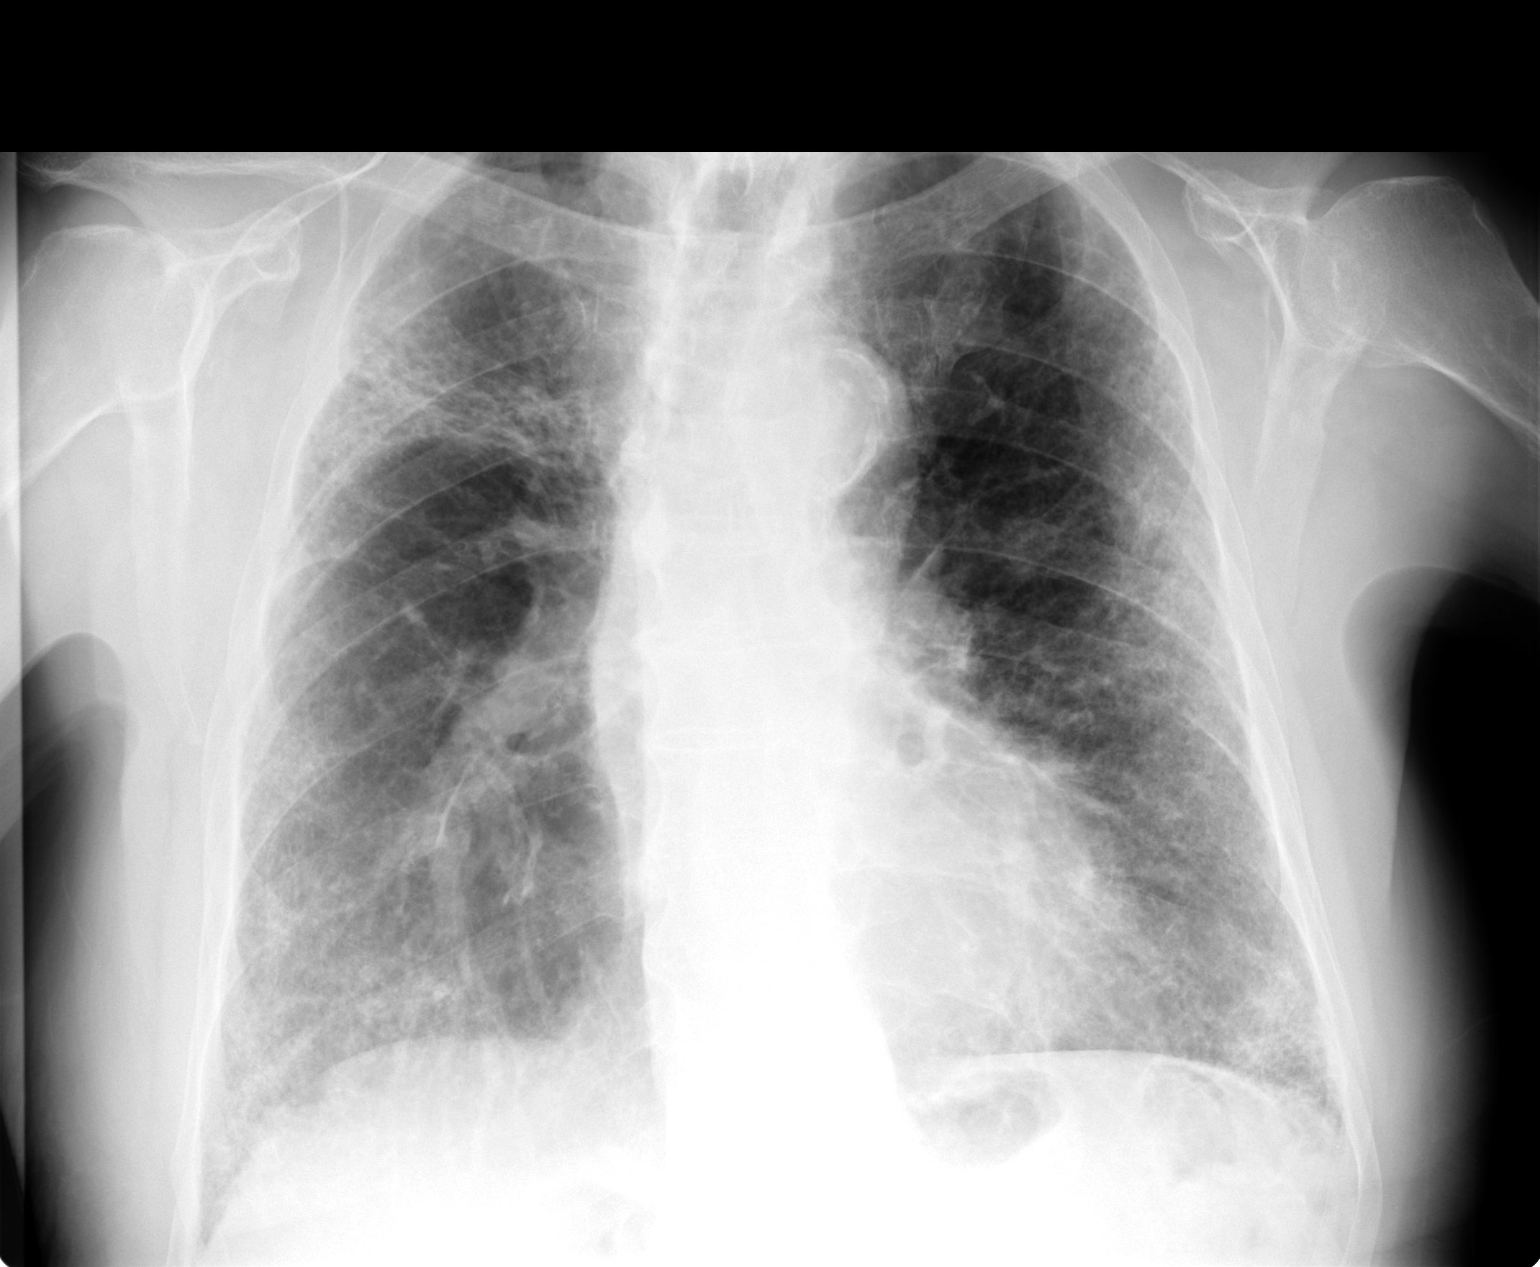

[view not recorded (2 of 2)]
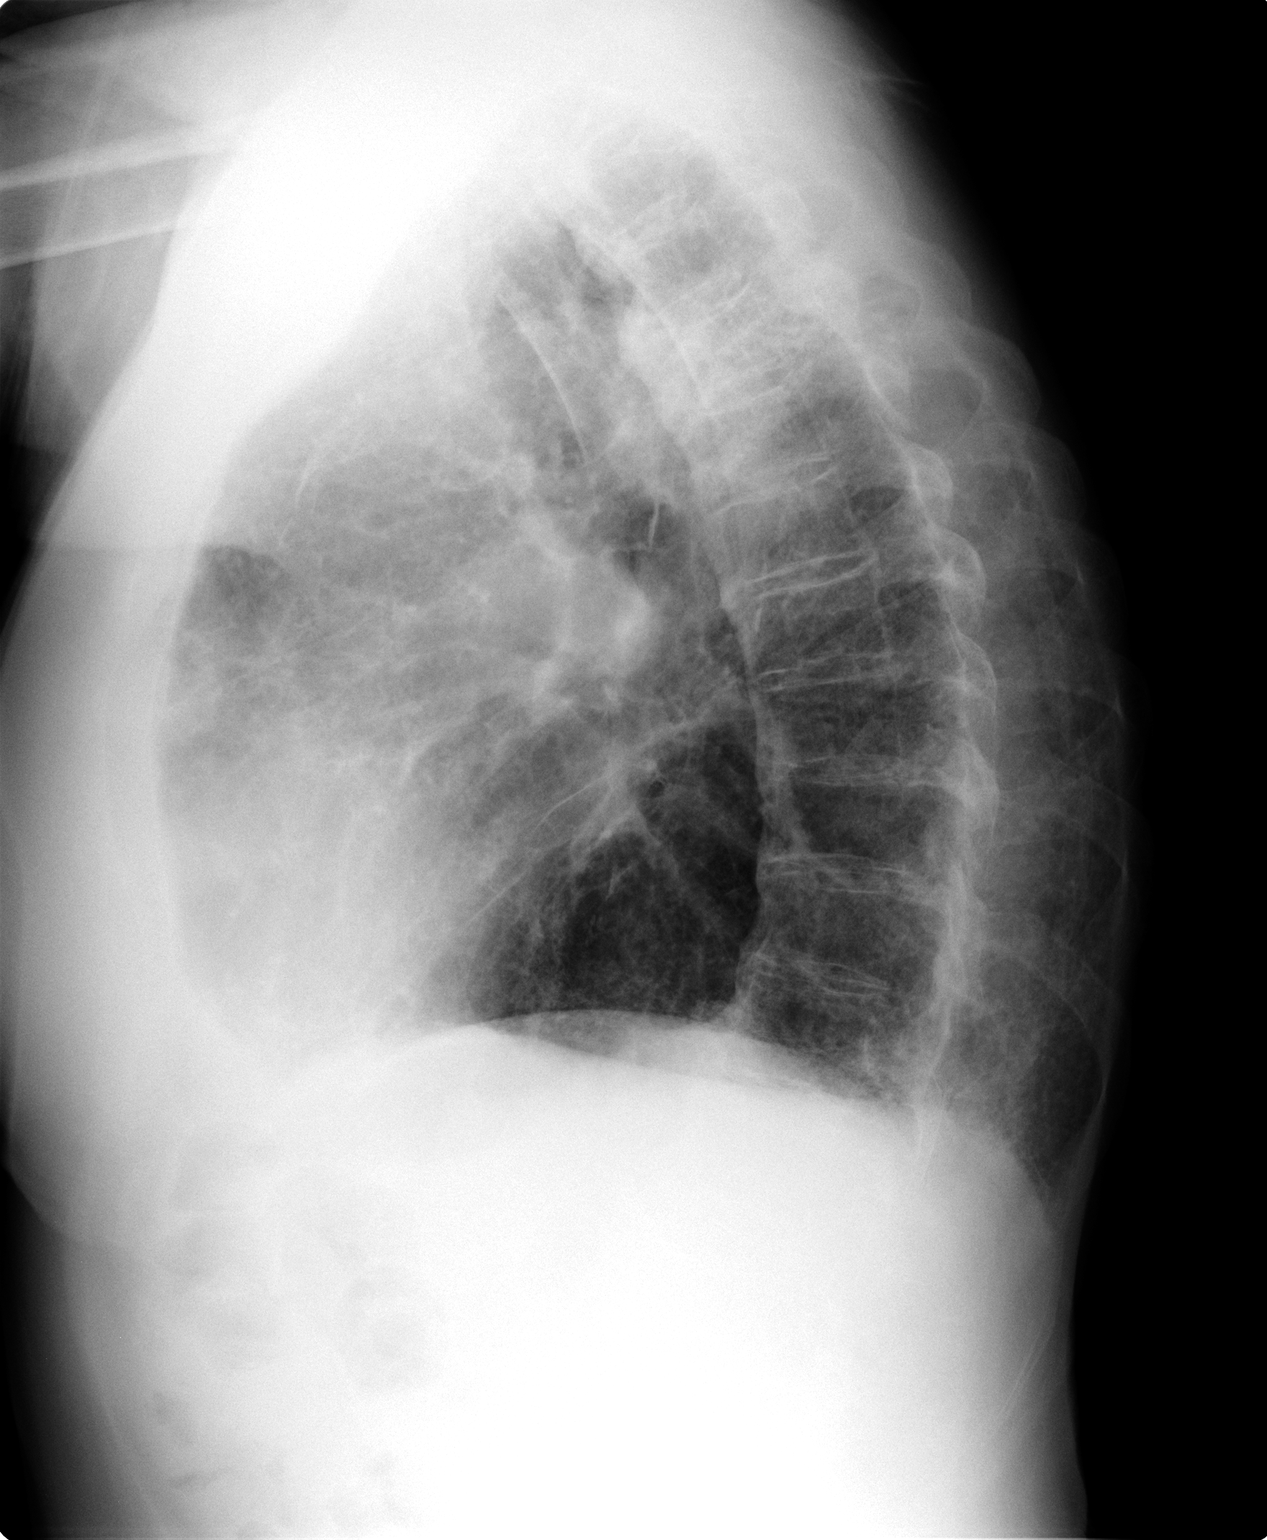

[2 of 2 positions shown; findings below may reference images not displayed]

FINDINGS: Cardiac shadow is stable.  Diffuse interstitial fibrotic
changes are noted bilaterally.  These are stable from prior exam.
No acute infiltrate is seen.  Hyperinflation is again noted.  No
bony abnormality is seen.
IMPRESSION: Chronic changes without acute abnormality.
